# Patient Record
Sex: Male | Born: 1960 | Race: Black or African American | Hispanic: No | Marital: Married | Smoking: Never smoker
Health system: Southern US, Community
[De-identification: ages and names within clinical notes are randomized; demographics above are authoritative.]

## PROBLEM LIST (undated history)

## (undated) DIAGNOSIS — I10 Essential (primary) hypertension: Secondary | ICD-10-CM

## (undated) HISTORY — PX: SHOULDER SURGERY: SHX246

---

## 2000-11-19 ENCOUNTER — Emergency Department (HOSPITAL_COMMUNITY): Admission: EM | Admit: 2000-11-19 | Discharge: 2000-11-20 | Payer: Self-pay

## 2001-05-17 ENCOUNTER — Encounter: Payer: Self-pay | Admitting: *Deleted

## 2001-05-17 ENCOUNTER — Encounter: Admission: RE | Admit: 2001-05-17 | Discharge: 2001-05-17 | Payer: Self-pay | Admitting: *Deleted

## 2001-08-19 ENCOUNTER — Encounter: Payer: Self-pay | Admitting: Emergency Medicine

## 2001-08-19 ENCOUNTER — Emergency Department (HOSPITAL_COMMUNITY): Admission: EM | Admit: 2001-08-19 | Discharge: 2001-08-19 | Payer: Self-pay

## 2006-04-06 ENCOUNTER — Emergency Department (HOSPITAL_COMMUNITY): Admission: EM | Admit: 2006-04-06 | Discharge: 2006-04-06 | Payer: Self-pay | Admitting: Emergency Medicine

## 2010-05-11 ENCOUNTER — Emergency Department (HOSPITAL_BASED_OUTPATIENT_CLINIC_OR_DEPARTMENT_OTHER): Admission: EM | Admit: 2010-05-11 | Discharge: 2010-05-11 | Payer: Self-pay | Admitting: Emergency Medicine

## 2011-03-02 ENCOUNTER — Inpatient Hospital Stay (HOSPITAL_COMMUNITY): Payer: Self-pay

## 2011-03-02 ENCOUNTER — Emergency Department (HOSPITAL_BASED_OUTPATIENT_CLINIC_OR_DEPARTMENT_OTHER)
Admission: EM | Admit: 2011-03-02 | Discharge: 2011-03-02 | Disposition: A | Discharge status: Other Acute Inpatient Hospital | Payer: Self-pay | Attending: Emergency Medicine | Admitting: Emergency Medicine

## 2011-03-02 ENCOUNTER — Inpatient Hospital Stay (HOSPITAL_COMMUNITY)
Admission: EM | Admit: 2011-03-02 | Discharge: 2011-03-05 | DRG: 638 | Disposition: A | Discharge status: Home / Self Care | Payer: Self-pay | Source: Other Acute Inpatient Hospital | Attending: Internal Medicine | Admitting: Internal Medicine

## 2011-03-02 DIAGNOSIS — E871 Hypo-osmolality and hyponatremia: Secondary | ICD-10-CM | POA: Diagnosis not present

## 2011-03-02 DIAGNOSIS — N179 Acute kidney failure, unspecified: Secondary | ICD-10-CM | POA: Diagnosis present

## 2011-03-02 DIAGNOSIS — E1169 Type 2 diabetes mellitus with other specified complication: Secondary | ICD-10-CM | POA: Insufficient documentation

## 2011-03-02 DIAGNOSIS — E1100 Type 2 diabetes mellitus with hyperosmolarity without nonketotic hyperglycemic-hyperosmolar coma (NKHHC): Principal | ICD-10-CM | POA: Diagnosis present

## 2011-03-02 DIAGNOSIS — E876 Hypokalemia: Secondary | ICD-10-CM | POA: Diagnosis not present

## 2011-03-02 DIAGNOSIS — I10 Essential (primary) hypertension: Secondary | ICD-10-CM | POA: Diagnosis present

## 2011-03-02 DIAGNOSIS — R350 Frequency of micturition: Secondary | ICD-10-CM | POA: Insufficient documentation

## 2011-03-02 DIAGNOSIS — E785 Hyperlipidemia, unspecified: Secondary | ICD-10-CM | POA: Diagnosis present

## 2011-03-02 DIAGNOSIS — N289 Disorder of kidney and ureter, unspecified: Secondary | ICD-10-CM | POA: Insufficient documentation

## 2011-03-02 DIAGNOSIS — E669 Obesity, unspecified: Secondary | ICD-10-CM | POA: Diagnosis present

## 2011-03-02 DIAGNOSIS — Z23 Encounter for immunization: Secondary | ICD-10-CM

## 2011-03-02 DIAGNOSIS — E781 Pure hyperglyceridemia: Secondary | ICD-10-CM | POA: Diagnosis present

## 2011-03-02 LAB — GLUCOSE, CAPILLARY
Glucose-Capillary: >600 mg/dL (ref 70–99)
Glucose-Capillary: 258 mg/dL — ABNORMAL HIGH (ref 70–99)
Glucose-Capillary: 287 mg/dL — ABNORMAL HIGH (ref 70–99)
Glucose-Capillary: 275 mg/dL — ABNORMAL HIGH (ref 70–99)
Glucose-Capillary: 220 mg/dL — ABNORMAL HIGH (ref 70–99)

## 2011-03-02 LAB — URINE MICROSCOPIC-ADD ON
RBC / HPF: NONE SEEN RBC/hpf (ref <3)
WBC, UA: NONE SEEN WBC/hpf (ref <3)

## 2011-03-02 LAB — URINALYSIS, ROUTINE W REFLEX MICROSCOPIC
Bilirubin Urine: NEGATIVE
Glucose, UA: >1000 mg/dL — AB
Hgb urine dipstick: NEGATIVE
Ketones, ur: NEGATIVE mg/dL
Leukocytes, UA: NEGATIVE
Nitrite: NEGATIVE
Protein, ur: NEGATIVE mg/dL
Specific Gravity, Urine: 1.034 — ABNORMAL HIGH (ref 1.005–1.030)
Urobilinogen, UA: 0.2 mg/dL (ref 0.0–1.0)
pH: 5.5 (ref 5.0–8.0)

## 2011-03-02 LAB — HEMOGLOBIN A1C
Hgb A1c MFr Bld: 11.7 % — ABNORMAL HIGH (ref <5.7)
Mean Plasma Glucose: 289 mg/dL — ABNORMAL HIGH (ref <117)

## 2011-03-02 LAB — BASIC METABOLIC PANEL
BUN: 27 mg/dL — ABNORMAL HIGH (ref 6–23)
CO2: 25 mEq/L (ref 19–32)
Calcium: 10.2 mg/dL (ref 8.4–10.5)
Chloride: 81 mEq/L — ABNORMAL LOW (ref 96–112)
Creatinine, Ser: 1.8 mg/dL — ABNORMAL HIGH (ref 0.50–1.35)
GFR calc Af Amer: 49 mL/min — ABNORMAL LOW (ref >60)
GFR calc non Af Amer: 41 mL/min — ABNORMAL LOW (ref >60)
Glucose, Bld: 843 mg/dL (ref 70–99)
Potassium: 4.1 mEq/L (ref 3.5–5.1)
Sodium: 124 mEq/L — ABNORMAL LOW (ref 135–145)

## 2011-03-02 LAB — MRSA PCR SCREENING: MRSA by PCR: NEGATIVE

## 2011-03-02 LAB — DIFFERENTIAL
Basophils Relative: 0 % (ref 0–1)
Eosinophils Relative: 1 % (ref 0–5)
Monocytes Absolute: 0.5 10*3/uL (ref 0.1–1.0)
Monocytes Relative: 10 % (ref 3–12)
Neutro Abs: 1.7 10*3/uL (ref 1.7–7.7)

## 2011-03-02 LAB — COMPREHENSIVE METABOLIC PANEL
ALT: 25 U/L (ref 0–53)
Albumin: 4.1 g/dL (ref 3.5–5.2)
Alkaline Phosphatase: 101 U/L (ref 39–117)
Potassium: 4 mEq/L (ref 3.5–5.1)
Sodium: 136 mEq/L (ref 135–145)
Total Protein: 8.3 g/dL (ref 6.0–8.3)

## 2011-03-02 LAB — CBC
Hemoglobin: 16 g/dL (ref 13.0–17.0)
MCH: 29.5 pg (ref 26.0–34.0)
MCHC: 35.4 g/dL (ref 30.0–36.0)
RDW: 12.2 % (ref 11.5–15.5)

## 2011-03-02 LAB — CREATININE, URINE, RANDOM: Creatinine, Urine: 187.02 mg/dL

## 2011-03-03 LAB — GLUCOSE, CAPILLARY
Glucose-Capillary: 190 mg/dL — ABNORMAL HIGH (ref 70–99)
Glucose-Capillary: 112 mg/dL — ABNORMAL HIGH (ref 70–99)
Glucose-Capillary: 171 mg/dL — ABNORMAL HIGH (ref 70–99)
Glucose-Capillary: 172 mg/dL — ABNORMAL HIGH (ref 70–99)
Glucose-Capillary: 178 mg/dL — ABNORMAL HIGH (ref 70–99)
Glucose-Capillary: 248 mg/dL — ABNORMAL HIGH (ref 70–99)
Glucose-Capillary: 277 mg/dL — ABNORMAL HIGH (ref 70–99)
Glucose-Capillary: 327 mg/dL — ABNORMAL HIGH (ref 70–99)

## 2011-03-03 LAB — PHOSPHORUS: Phosphorus: 3 mg/dL (ref 2.3–4.6)

## 2011-03-03 LAB — CBC
Hemoglobin: 14.4 g/dL (ref 13.0–17.0)
MCH: 29.9 pg (ref 26.0–34.0)
MCHC: 35.6 g/dL (ref 30.0–36.0)
Platelets: 176 10*3/uL (ref 150–400)
RBC: 4.81 MIL/uL (ref 4.22–5.81)

## 2011-03-03 LAB — PROTIME-INR
INR: 0.91 (ref 0.00–1.49)
Prothrombin Time: 12.5 seconds (ref 11.6–15.2)

## 2011-03-03 LAB — DRUGS OF ABUSE SCREEN W/O ALC, ROUTINE URINE
Barbiturate Quant, Ur: NEGATIVE
Cocaine Metabolites: NEGATIVE
Creatinine,U: 176.2 mg/dL
Phencyclidine (PCP): NEGATIVE
Propoxyphene: NEGATIVE

## 2011-03-03 LAB — COMPREHENSIVE METABOLIC PANEL
ALT: 22 U/L (ref 0–53)
AST: 22 U/L (ref 0–37)
Alkaline Phosphatase: 75 U/L (ref 39–117)
CO2: 27 mEq/L (ref 19–32)
Calcium: 8.6 mg/dL (ref 8.4–10.5)
Potassium: 3.2 mEq/L — ABNORMAL LOW (ref 3.5–5.1)
Sodium: 137 mEq/L (ref 135–145)
Total Protein: 6.9 g/dL (ref 6.0–8.3)

## 2011-03-03 LAB — DIFFERENTIAL
Basophils Absolute: 0 10*3/uL (ref 0.0–0.1)
Basophils Relative: 1 % (ref 0–1)
Eosinophils Absolute: 0.1 10*3/uL (ref 0.0–0.7)
Monocytes Relative: 13 % — ABNORMAL HIGH (ref 3–12)
Neutro Abs: 1.2 10*3/uL — ABNORMAL LOW (ref 1.7–7.7)
Neutrophils Relative %: 27 % — ABNORMAL LOW (ref 43–77)

## 2011-03-03 LAB — MAGNESIUM: Magnesium: 2.3 mg/dL (ref 1.5–2.5)

## 2011-03-03 LAB — LIPID PANEL
HDL: 31 mg/dL — ABNORMAL LOW (ref >39)
LDL Cholesterol: UNDETERMINED mg/dL (ref 0–99)
Total CHOL/HDL Ratio: 6 RATIO

## 2011-03-04 LAB — GLUCOSE, CAPILLARY
Glucose-Capillary: 300 mg/dL — ABNORMAL HIGH (ref 70–99)
Glucose-Capillary: 202 mg/dL — ABNORMAL HIGH (ref 70–99)

## 2011-03-04 LAB — CBC
MCHC: 35.6 g/dL (ref 30.0–36.0)
Platelets: 144 10*3/uL — ABNORMAL LOW (ref 150–400)
RDW: 12.3 % (ref 11.5–15.5)

## 2011-03-04 LAB — BASIC METABOLIC PANEL
Chloride: 102 mEq/L (ref 96–112)
GFR calc non Af Amer: >60 mL/min (ref >60)
Glucose, Bld: 273 mg/dL — ABNORMAL HIGH (ref 70–99)
Potassium: 4.2 mEq/L (ref 3.5–5.1)
Sodium: 134 mEq/L — ABNORMAL LOW (ref 135–145)

## 2011-03-05 LAB — BASIC METABOLIC PANEL
Chloride: 103 mEq/L (ref 96–112)
GFR calc Af Amer: >60 mL/min (ref >60)
GFR calc non Af Amer: >60 mL/min (ref >60)
Glucose, Bld: 221 mg/dL — ABNORMAL HIGH (ref 70–99)
Potassium: 4.3 mEq/L (ref 3.5–5.1)
Sodium: 137 mEq/L (ref 135–145)

## 2011-03-05 LAB — CBC
Hemoglobin: 14.1 g/dL (ref 13.0–17.0)
MCHC: 35.5 g/dL (ref 30.0–36.0)
RDW: 12.3 % (ref 11.5–15.5)
WBC: 3.7 10*3/uL — ABNORMAL LOW (ref 4.0–10.5)

## 2011-03-05 LAB — GLUCOSE, CAPILLARY: Glucose-Capillary: 215 mg/dL — ABNORMAL HIGH (ref 70–99)

## 2011-03-08 NOTE — H&P (Signed)
[name] George, [name] George NO.:  [id number]  MEDICAL RECORD NO.:  [id number]  LOCATION:  3312                         FACILITY:  [facility]  PHYSICIAN:  Kathlen Mody, MD       DATE OF BIRTH:  April 11, 1961  DATE OF ADMISSION:  03/02/2011 DATE OF DISCHARGE:                             HISTORY & PHYSICAL   CHIEF COMPLAINT:  Transfer from [facility] for hyperglycemia with sugars in the 800s and new onset diabetes.  HISTORY OF PRESENT ILLNESS:  This is a 50 year old gentleman with history of hypertension, hyperlipidemia, and obesity who was transferred from [facility] for hyperglycemia with sugars in 800s and newly diagnosed diabetes.  The patient reports dizziness, blurry vision, and generalized weakness since 1 week associated with polyuria and polydipsia.  The patient denies any chest pain, shortness of breath, orthopnea, PND, or any syncope or palpitations.  Denies any shortness of breath.  The patient denies any nausea, vomiting, abdominal pain, or diarrhea.  No known family history of diabetes.  Over the last 1 week, the patient is complaining of polyuria and polydipsia.  The patient denies any headache or blurry vision at this time.  The patient denies any tingling or numbness or any focal weakness.  At 5 o'clock this a.m., the patient was on his way to work and he felt dizzy and he came to [employer] at which point his glucose was checked and it was in the 800s and he was transferred to [facility] for further evaluation and management.  REVIEW OF SYSTEMS:  See HPI, otherwise negative.  PAST MEDICAL HISTORY: 1. Hypertension. 2. Hyperlipidemia. 3. Obesity.  PAST SURGICAL HISTORY:  Right shoulder surgery for dislocated shoulder.  FAMILY HISTORY:  Mother has hypertension.  SOCIAL HISTORY:  The patient denies smoking.  Occasional EtOH.  Denies any recreational drugs.  The patient lives at home with his wife.  He works as a Ecologist.  ALLERGIES:  No known drug allergies.  HOME MEDICATIONS:  The patient is on lisinopril and hydrochlorothiazide.  PHYSICAL EXAMINATION:  VITAL SIGNS:  The patient's vitals on arrival, temperature of 97.8, pulse is 80 per minute, blood pressure 137/79, respirations 20-28, and saturating 98% on room air.  On arrival, his CBC was 531. GENERAL:  He is alert, afebrile, oriented x3, comfortable, in no acute distress. HEENT:  Pupils are reacting to light and accommodations.  No JVD.  Dry mucous membranes.  No scleral icterus. CARDIOVASCULAR:  S1 and S2 heard.  No rubs, murmurs, or gallops. Regular rate and rhythm. RESPIRATORY:  Chest is clear to auscultation bilaterally.  No wheezing or rhonchi. ABDOMEN:  Obese, soft, nontender, and nondistended.  Bowel sounds are heard. EXTREMITIES:  No pedal edema, cyanosis, or clubbing. NEUROLOGICAL:  No focal deficits at this point.  PERTINENT LABORATORY DATA:  At [facility], the patient had a urinalysis which was negative for nitrites and leukocytes with a urine glucose of more than 1000, negative ketones, no bilirubin.  Basic metabolic panel showed a glucose of 843 at 5:30 with a sodium of 124, potassium of 4.1, BUN 27, and creatinine 1.80.  CBC with  differential was within normal limits.  DIAGNOSTIC STUDIES:  None.  ASSESSMENT:  This is a 50 year old gentleman with history of hypertension and hyperlipidemia who is admitted for hyperglycemia, newly diagnosed diabetes.  The patient is in Step-Down at this time.  He is on a glucose nebulizer.  We will continue the glucose stabilizer for now. We will get a CMP in an hour.  We will get a hemoglobin A1c, fasting lipids, and a TSH.  Start the patient on IV normal saline at 150 mL per hour.  The patient will need inpatient diabetes education and also schedule an outpatient diabetes education.  The patient will ultimately need ophthalmology followup as outpatient.  Newly diagnosed  type 2 diabetes and hyperglycemia.  The patient's anion gap is 18.  At this time, we will get a repeat CMP in about an hour.  The patient's potassium is 4.1.  Acute renal failure, most likely prerenal from dehydration.  The patient is being hydrated with normal saline at 150 mL.  At the same time, we will get some urine sodium, urine creatinine, and ultrasound of kidney to rule out obstruction.  Hypertension appears to be controlled.  Lisinopril and hydrochlorothiazide are on hold for now secondary to renal failure.  We will restart them later on after the renal failure resolves.  Hyperlipidemia.  We will get a fasting lipid profile and start him on statins as needed.  Hyponatremia, most likely pseudohyponatremia secondary to hyperglycemia.  Deep vein thrombosis prophylaxis.  Subcutaneous Lovenox.  DIET:  Carb-modified diet.  ACTIVITY:  As tolerated.  The patient is a full code.          ______________________________ Kathlen Mody, MD     VA/MEDQ  D:  03/02/2011  T:  03/02/2011  Job:  [id number]  Electronically Signed by Kathlen Mody MD on 03/08/2011 02:34:13 AM

## 2011-03-16 NOTE — Discharge Summary (Signed)
[name], [name] NO.:  [id number]  MEDICAL RECORD NO.:  [id number]  LOCATION:  4503                         FACILITY:  [facility]  PHYSICIAN:  Thad Ranger, MD       DATE OF BIRTH:  [birth year]  DATE OF ADMISSION:  03/02/2011 DATE OF DISCHARGE:                        DISCHARGE SUMMARY - REFERRING   DISCHARGE DIAGNOSES: 1. Hyperosmolar hyperglycemia, improved. 2. New onset diabetes mellitus, uncontrolled. 3. Acute kidney injury secondary to dehydration, resolved. 4. Dehydration, resolved. 5. Hypertension. 6. Hyperlipidemia with hypertriglyceridemia. 7. Hyponatremia likely pseudohyponatremia secondary to hyperosmolar     hyperglycemia, improved.  CONSULTATIONS:  Dr. Talmage Coin on the phone consultation.  DISCHARGE MEDICATIONS: 1. Tradjenta 5 mg p.o. daily. 2. Metformin 1000 mg p.o. b.i.d. with meals. 3. Artificial tears 2 drops in both eyes daily as needed. 4. Fish oil over-the-counter 1 capsule p.o. daily.5. Lisinopril/hydrochlorothiazide 20/25 mg 1 tablet p.o. daily. 6. Vitamin E OTC 1 capsule p.o. daily.  BRIEF HISTORY OF PRESENT ILLNESS:  At the time of admission, [name] George is a 50 year old male with history of hypertension, hyperlipidemia.  He was transferred from [facility] for hyperglycemia with sugars in 800s and newly diagnosed with diabetes mellitus.  The patient reported dizziness, blurry vision and generalized weakness for a week associated with polyuria and polydipsia.  The patient presented to [facility] where his glucose was checked and was in 800s.  He was transferred to [facility] for further evaluation and management.  RADIOLOGICAL DATA:  Chest x-ray 2-view July 04, low-volume exam with minimal basilar atelectasis worse with scarring.  Renal ultrasound July 04 showed, 1. Both kidneys are normal in size without significant cortical     thinning or hydronephrosis. 2. A 1.5 cm cystic lesion in the lower  pole of the left kidney is not     clearly simple, consider ultrasound follow-up to assess the     stability.  PERTINENT LAB DIAGNOSTIC DATA:  UA at the time of admission showed more than 1000 glucose but negative protein.  BMET at the time of admission showed sodium 124, bicarb 25, glucose 843, creatinine 1.8, BUN 27.  CBC was unremarkable.  HbA1c 11.7 with mean plasma glucose of 289.  Urine drug screen was negative.  TSH 1.4.  Lipid profile showed cholesterol of 185, triglycerides 616, HDL 31.  BMET at the time of discharge, sodium 137, potassium 4.3, BUN 10, creatinine 1.0.  BRIEF HOSPITALIZATION COURSE:  [name] George is a 50 year old man who was admitted with hyperosmolar hyperglycemia. 1. Hyperosmolar hyperglycemia with new-onset diabetes mellitus.  The     patient was started on glucose stabilizer in a Step-Down Unit.  He     received IV insulin drip with aggressive IV fluid hydration.     Subsequently, the patient was transitioned to Lantus and sliding     scale insulin.  The patient was noticed to have an HbA1c of 11.7.     The patient's blood sugars did improve with insulin.  However, he     refused insulin at this time.  The patient stated that he would be     unable to keep his job especially with  new onset diabetes mellitus     requiring insulin as well as his new job with truck driving.  He     was started on metformin and I also discussed in detail with Dr.     Talmage Coin from Endocrinology with this particular scenario.  Per     Dr. Sharl Ma, it is okay to start him on second agent and the patient     was started on Tradjenta.  The patient was counseled strongly on     long-term effects of uncontrolled diabetes mellitus and compliance     with his medication.  He was also provided with prescription for     glucometer and counseled on keeping the log.  The patient will have     a follow-up appointment with Dr. Reather Littler from Endocrinology on     April 13, 2011, and further  adjustments in his medications can be     made during that time. 2. Acute kidney injury, likely prerenal from dehydration from     hyperosmolar hyperglycemia.  The patient was hydrated with normal     saline with IV fluids and the creatinine function has improved to     1.0 at the time of discharge.  Renal ultrasound was obtained and     did not show any acute obstruction.  He did show 1.5 cm cystic     lesion in the lower pole of left kidney.  The patient should have     repeat renal ultrasound in 3-6 months. 3. Hyperlipidemia.  The patient was not on any lipid lowering agents.     Lipid profile showed elevated triglycerides at 616.  The patient     was strongly counseled on weight loss and was started on Pravachol. 4. Hyponatremia from hyperglycemia.  This has resolved.  Currently at     the time of the dictation, sodium level is 137.  DISCHARGE DIET:  Carb-modified diet.  This was clearly explained to the patient.  Discharge follow-up with Dr. Reather Littler on April 13, 2011, at 11 a.m.  PHYSICAL EXAMINATION:  VITAL SIGNS:  Temp 97.4, pulse 73, respirations 20, blood pressure 121/81, O2 sat is 98% on room air. GENERAL:  The patient is alert, awake and oriented x3, not in acute distress. HEENT:  Anicteric sclerae.  Pink conjunctivae.  Pupils are reactive to light and accommodation.  EOMI. NECK: George.  No lymphopathy.  No JVD. CVS:  S1 and S2 clear.  Regular rate and rhythm. CHEST:  Clear to auscultation bilaterally. ABDOMEN:  Soft, nontender. EXTREMITIES:  No cyanosis, clubbing or edema noted in upper or lower extremities bilaterally.  DISCHARGE TIME:  35 minutes.    Thad Ranger, MD    RR/MEDQ  D:  03/05/2011  T:  03/05/2011  Job:  [id number]  cc:   Reather Littler, M.D.  Electronically Signed by Andres Labrum RAI  on 03/16/2011 05:44:12 PM

## 2012-02-27 ENCOUNTER — Other Ambulatory Visit: Payer: Self-pay | Admitting: Internal Medicine

## 2012-11-24 IMAGING — US US RENAL
1 series · 14 of 25 positions shown · non-contrast
Comparison: None.

CLINICAL DATA: Urinary frequency.  Renal insufficiency.

RENAL/URINARY TRACT ULTRASOUND COMPLETE

[Series 1: us renal · 0.41mm/px · 14 of 35 slices shown]
[im 1/35]
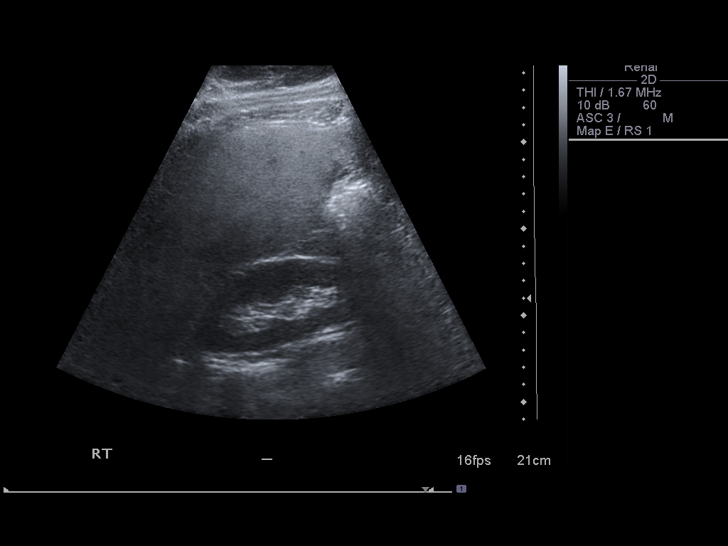
[im 3/35]
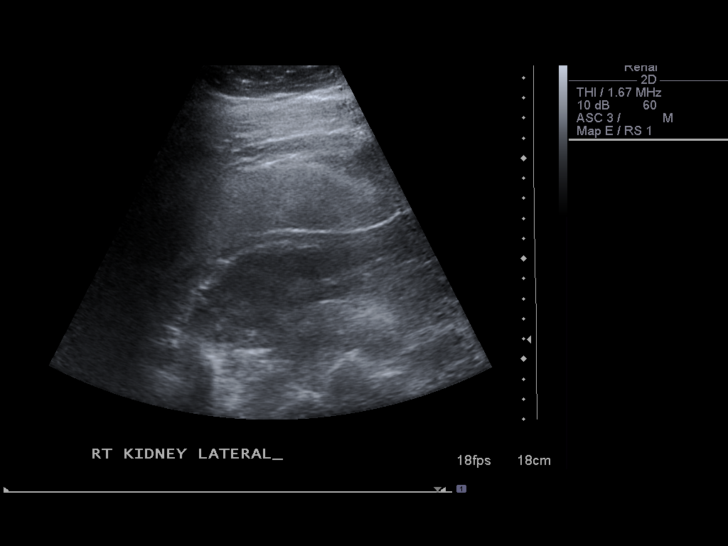
[im 6/35]
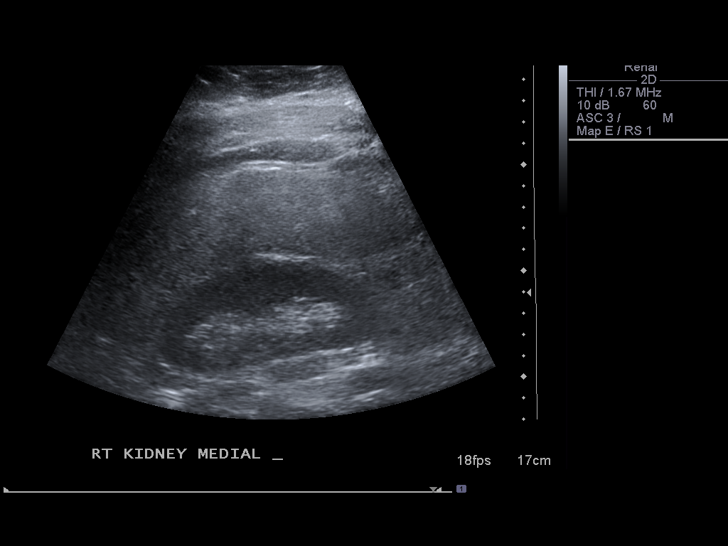
[im 9/35]
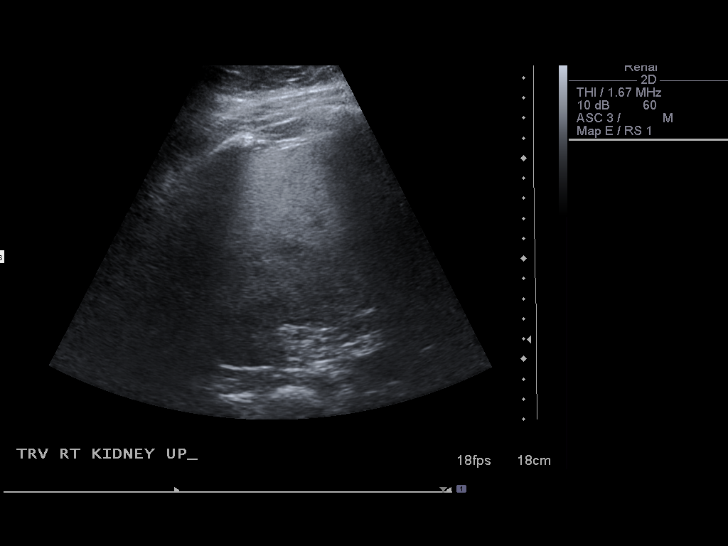
[im 12/35]
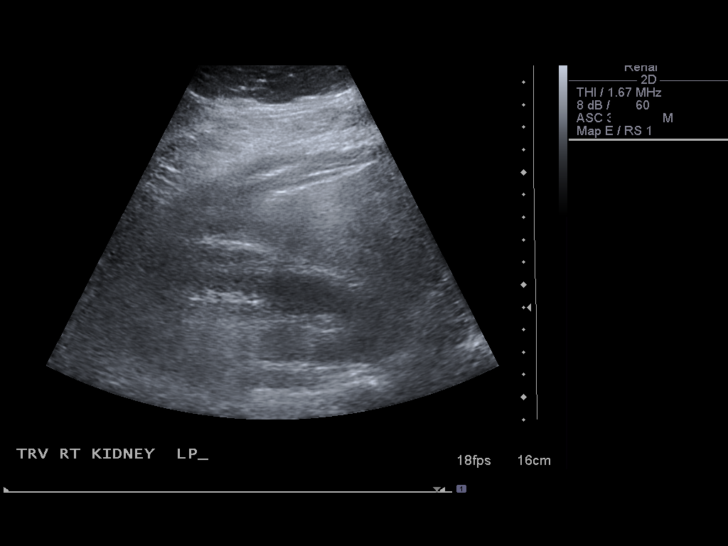
[im 13/35]
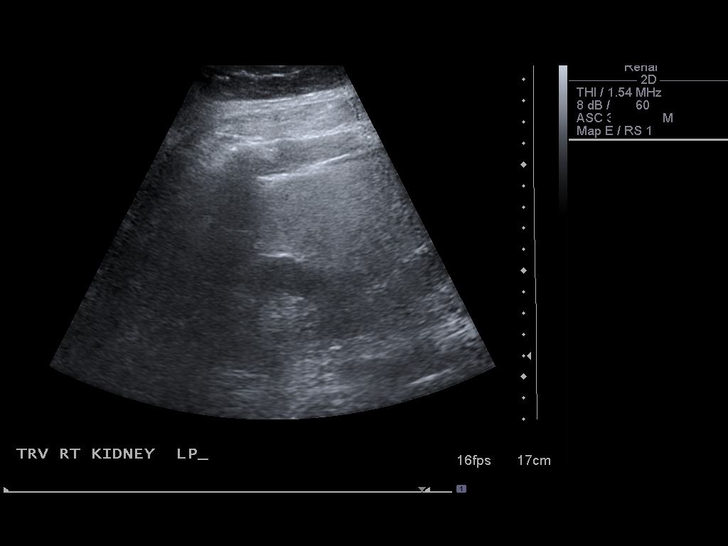
[im 16/35]
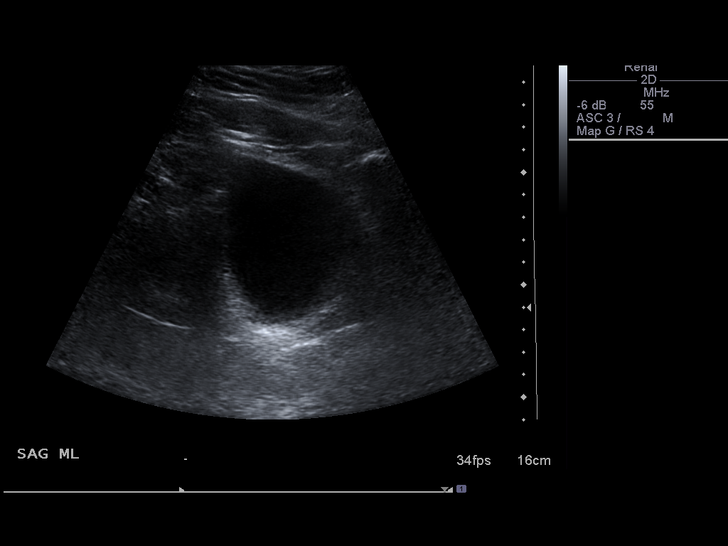
[im 19/35]
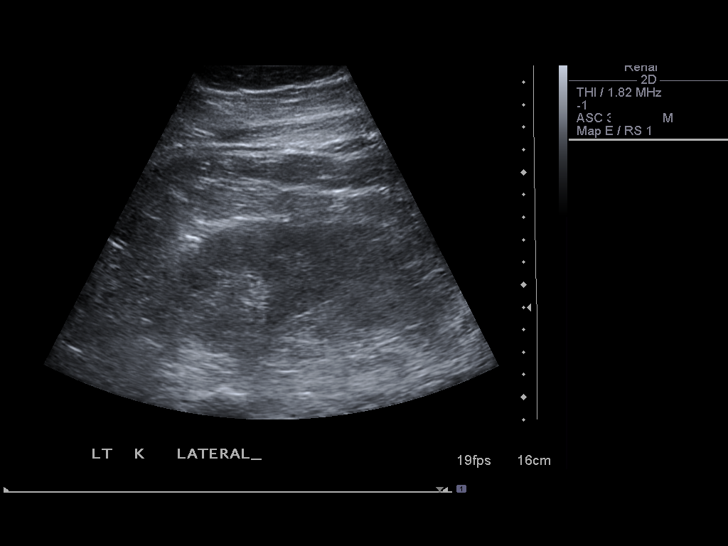
[im 22/35]
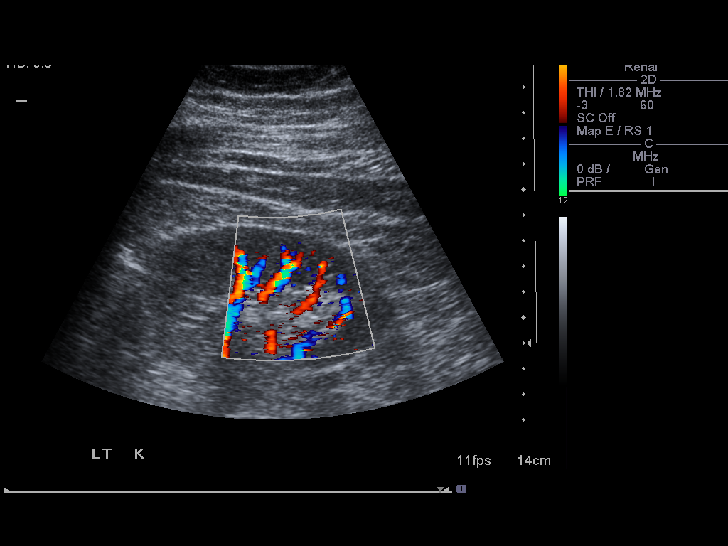
[im 23/35]
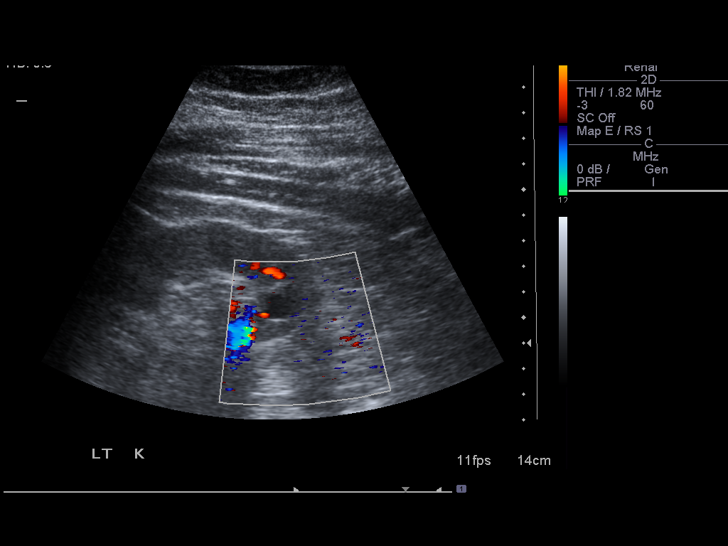
[im 26/35]
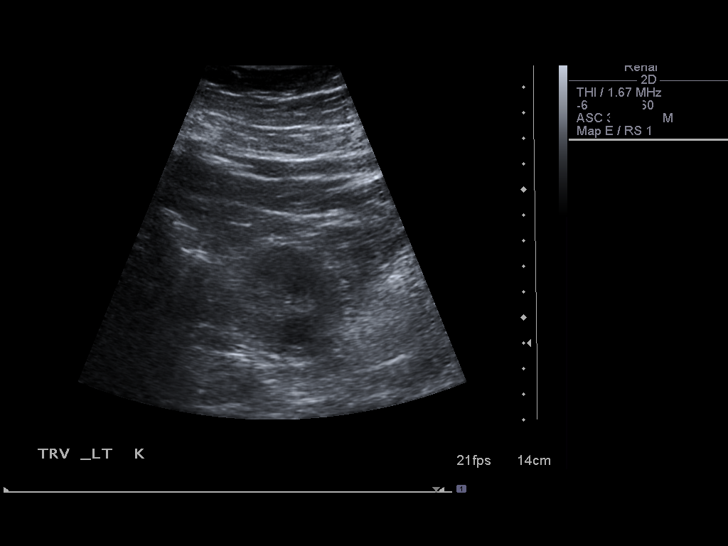
[im 29/35]
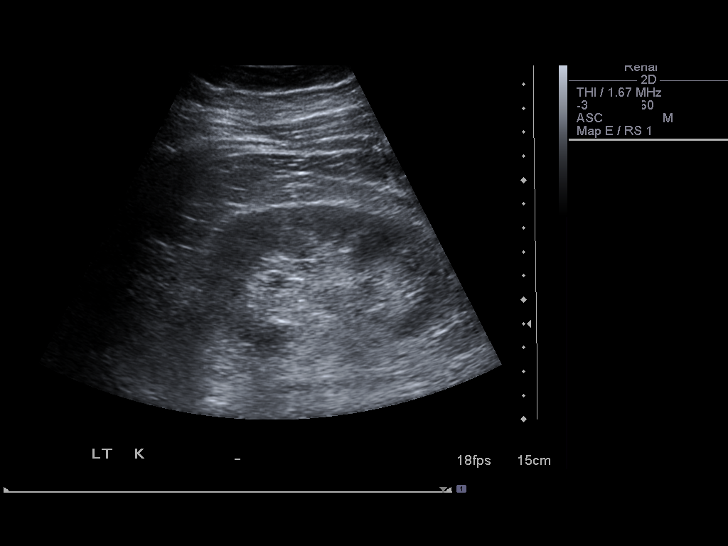
[im 32/35]
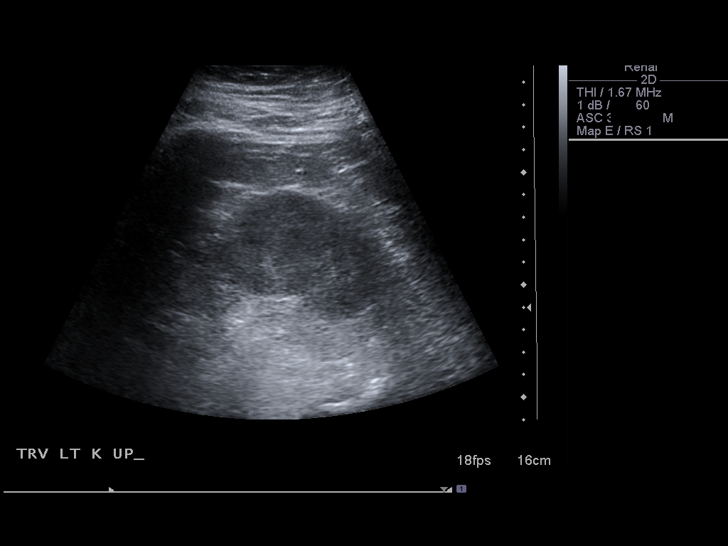
[im 35/35]
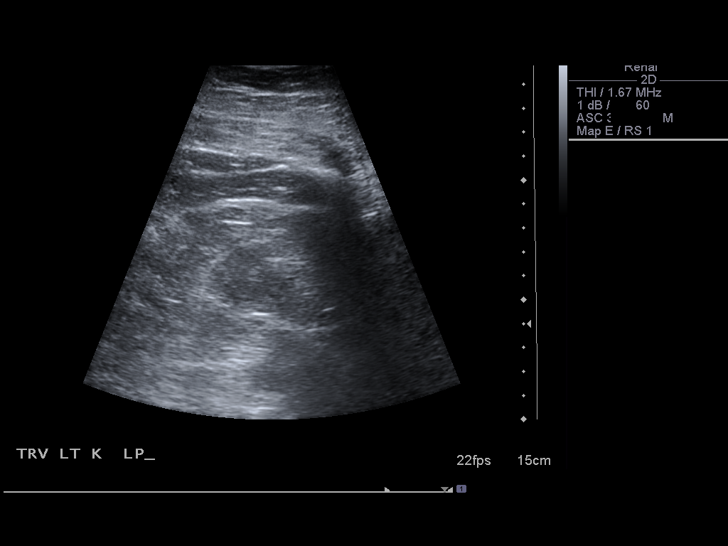

[14 of 25 positions shown; findings below may reference images not displayed]

FINDINGS: Right Kidney:  No hydronephrosis.  Well-preserved cortex.  Normal
size and parenchymal echotexture without focal abnormalities.
Renal length 11.1 cm.

Left Kidney:  No hydronephrosis.  Well preserved cortex.  Renal
length is 11.1 cm.  In the lower pole, there is a hypoechoic nodule
measuring 1.2 x 1.5 x 1.2 cm.  This is not well visualized,
although appears cystic and demonstrates no blood flow with color
Doppler.  No other focal renal lesions are demonstrated.

Bladder:  Unremarkable for degree of distension.

Incidentally noted is hepatic echogenicity suggesting hepatic
steatosis.
IMPRESSION: 1.  Both kidneys are normal in size without significant cortical
thinning or hydronephrosis.
2.  1.5 cm cystic lesion in the lower pole of the left kidney is
not clearly simple. Consider ultrasound followup to assess
stability.

## 2015-05-08 ENCOUNTER — Emergency Department (HOSPITAL_BASED_OUTPATIENT_CLINIC_OR_DEPARTMENT_OTHER)
Admission: EM | Admit: 2015-05-08 | Discharge: 2015-05-08 | Discharge status: Against Medical Advice | Payer: Commercial Managed Care - PPO | Attending: Emergency Medicine | Admitting: Emergency Medicine

## 2015-05-08 ENCOUNTER — Encounter (HOSPITAL_BASED_OUTPATIENT_CLINIC_OR_DEPARTMENT_OTHER): Payer: Self-pay | Admitting: *Deleted

## 2015-05-08 DIAGNOSIS — J019 Acute sinusitis, unspecified: Secondary | ICD-10-CM | POA: Insufficient documentation

## 2015-05-08 DIAGNOSIS — I10 Essential (primary) hypertension: Secondary | ICD-10-CM | POA: Insufficient documentation

## 2015-05-08 HISTORY — DX: Essential (primary) hypertension: I10

## 2015-05-08 NOTE — ED Notes (Signed)
Facial pain x 2 days. Headache. States he feels like he has sinusitis.

## 2015-05-08 NOTE — ED Notes (Signed)
Pt states he changed his mind and does not want to stay and be seen in the ED today. Informed pt that he is next to go back to a room, and encouraged him to stay for md eval. Pt states "I actually am feeling much better now, if I start to feel bac again I'll come back..." informed pt that if he leaves, he will have to start the triage and waiting process all over again, pt declines. Pt leaves ED with quick steady gait smiling in nad, accompanied by his wife.

## 2015-08-08 ENCOUNTER — Encounter (HOSPITAL_BASED_OUTPATIENT_CLINIC_OR_DEPARTMENT_OTHER): Payer: Self-pay | Admitting: *Deleted

## 2015-08-08 ENCOUNTER — Emergency Department (HOSPITAL_BASED_OUTPATIENT_CLINIC_OR_DEPARTMENT_OTHER)
Admission: EM | Admit: 2015-08-08 | Discharge: 2015-08-08 | Disposition: A | Discharge status: Home / Self Care | Payer: Self-pay | Attending: Emergency Medicine | Admitting: Emergency Medicine

## 2015-08-08 DIAGNOSIS — M546 Pain in thoracic spine: Secondary | ICD-10-CM | POA: Insufficient documentation

## 2015-08-08 DIAGNOSIS — M791 Myalgia, unspecified site: Secondary | ICD-10-CM

## 2015-08-08 DIAGNOSIS — M79621 Pain in right upper arm: Secondary | ICD-10-CM | POA: Insufficient documentation

## 2015-08-08 DIAGNOSIS — M79622 Pain in left upper arm: Secondary | ICD-10-CM | POA: Insufficient documentation

## 2015-08-08 DIAGNOSIS — I10 Essential (primary) hypertension: Secondary | ICD-10-CM | POA: Insufficient documentation

## 2015-08-08 LAB — CBC WITH DIFFERENTIAL/PLATELET
BASOS PCT: 0 %
Basophils Absolute: 0 10*3/uL (ref 0.0–0.1)
Eosinophils Absolute: 0.1 10*3/uL (ref 0.0–0.7)
Eosinophils Relative: 1 %
HEMATOCRIT: 47.1 % (ref 39.0–52.0)
HEMOGLOBIN: 15.7 g/dL (ref 13.0–17.0)
LYMPHS PCT: 48 %
Lymphs Abs: 2.4 10*3/uL (ref 0.7–4.0)
MCH: 29.2 pg (ref 26.0–34.0)
MCHC: 33.3 g/dL (ref 30.0–36.0)
MCV: 87.5 fL (ref 78.0–100.0)
MONOS PCT: 11 %
Monocytes Absolute: 0.6 10*3/uL (ref 0.1–1.0)
NEUTROS ABS: 2 10*3/uL (ref 1.7–7.7)
Neutrophils Relative %: 40 %
Platelets: 218 10*3/uL (ref 150–400)
RBC: 5.38 MIL/uL (ref 4.22–5.81)
RDW: 13.2 % (ref 11.5–15.5)
WBC: 4.9 10*3/uL (ref 4.0–10.5)

## 2015-08-08 LAB — BASIC METABOLIC PANEL
Anion gap: 7 (ref 5–15)
BUN: 15 mg/dL (ref 6–20)
CHLORIDE: 106 mmol/L (ref 101–111)
CO2: 26 mmol/L (ref 22–32)
CREATININE: 1.26 mg/dL — AB (ref 0.61–1.24)
Calcium: 9.4 mg/dL (ref 8.9–10.3)
GFR calc Af Amer: >60 mL/min (ref >60)
GFR calc non Af Amer: >60 mL/min (ref >60)
Glucose, Bld: 138 mg/dL — ABNORMAL HIGH (ref 65–99)
POTASSIUM: 3.9 mmol/L (ref 3.5–5.1)
SODIUM: 139 mmol/L (ref 135–145)

## 2015-08-08 MED ORDER — LISINOPRIL-HYDROCHLOROTHIAZIDE 10-12.5 MG PO TABS: 1.0000 | ORAL_TABLET | Freq: Every day | ORAL | Status: AC

## 2015-08-08 NOTE — ED Notes (Signed)
Patient c/o generalized body aches in upper part of his body for the past week, no fever, no n/v

## 2015-08-08 NOTE — ED Provider Notes (Signed)
CSN: [id number]     Arrival date & time 08/08/15  1324 History   First MD Initiated Contact with Patient 08/08/15 [phone]     No chief complaint on file.    (Consider location/radiation/quality/duration/timing/severity/associated sxs/prior Treatment) HPI  54 year old male with a history of hypertension presents with intermittent body aches for the past 2 weeks. The aching is mostly in his chest, upper arms, and upper back. Seems to come and go, does not occur every day. 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 A few days later he started having aching so he wasn't sure if that was related. He is currently on lisinopril although he takes it intermittently for hypertension and occasionally uses friends and families medicines for his blood pressure. He did not take any BP meds this AM. He denies headaches, fevers, vomiting, diarrhea, cough, sore throat, chest pressure/pain, or shortness of breath. No lower extremity aching. He does lift a lot or his job and he has had increased workload due to Christmas shopping and deliveries. However he does not think this is necessary the cause of it. He has been taking ibuprofen with good relief. He is worried he might have a "blood infection" due to the spider bite.  Past Medical History  Diagnosis Date  . Hypertension    Past Surgical History  Procedure Laterality Date  . Shoulder surgery     No family history on file. Social History  Substance Use Topics  . Smoking status: Never Smoker   . Smokeless tobacco: None  . Alcohol Use: No    Review of Systems  Constitutional: Negative for fever.  Respiratory: Positive for chest tightness (occasionally with the aches). Negative for cough and shortness of breath.   Cardiovascular: Negative for chest pain.  Gastrointestinal: Negative for vomiting and diarrhea.   Genitourinary: Negative for dysuria.  Musculoskeletal: Positive for myalgias.  Neurological: Negative for dizziness, weakness, numbness and headaches.  All other systems reviewed and are negative.     Allergies  Review of patient's allergies indicates no known allergies.  Home Medications   Prior to Admission medications   Medication Sig Start Date End Date Taking? Authorizing Provider  LISINOPRIL PO Take by mouth.    Historical Provider, MD   BP 178/105 mmHg  Pulse 67  Temp(Src) 97.8 F (36.6 C) (Oral)  Resp 18  Ht 6' (1.829 m)  Wt 169 lb (76.658 kg)  BMI 22.92 kg/m2  SpO2 96% Physical Exam  Constitutional: He is oriented to person, place, and time. He appears well-developed and well-nourished.  HENT:  Head: Normocephalic and atraumatic.  Right Ear: External ear normal.  Left Ear: External ear normal.  Nose: Nose normal.  Eyes: Right eye exhibits no discharge. Left eye exhibits no discharge.  Neck: Neck supple.  Cardiovascular: Normal rate, regular rhythm, normal heart sounds and intact distal pulses.   Pulmonary/Chest: Effort normal and breath sounds normal.  Abdominal: Soft. He exhibits no distension. There is no tenderness.  Musculoskeletal: He exhibits no edema.  No focal tenderness or spasm to upper chest/back and shoulders. Full ROM of major joints. No joint swelling. No erythema or abscess  Neurological: He is alert and oriented to person, place, and time.  Reflex Scores:      Bicep reflexes are 2+ on the right side and 2+ on the left side.  Patellar reflexes are 2+ on the right side and 2+ on the left side. 5/5 strength in all 4 extremities. Grossly normal sensation  Skin: Skin is warm and dry.  Nursing note and vitals reviewed.   ED Course  Procedures (including critical care time) Labs Review Labs Reviewed  BASIC METABOLIC PANEL - Abnormal; Notable for the following:    Glucose, Bld 138 (*)    Creatinine, Ser 1.26 (*)    All other components  within normal limits  CBC WITH DIFFERENTIAL/PLATELET    Imaging Review No results found. I have personally reviewed and evaluated these images and lab results as part of my medical decision-making.   EKG Interpretation   Date/Time:  Saturday August 08 2015 08:07:31 EST Ventricular Rate:  60 PR Interval:  174 QRS Duration: 86 QT Interval:  394 QTC Calculation: 394 R Axis:   26 Text Interpretation:  Normal sinus rhythm Nonspecific T wave abnormality  Abnormal ECG T waves less prominent than in 2007 Confirmed by Paloma Grange   MD, Quentyn Kolbeck ([id number]) on 08/08/2015 8:13:39 AM      MDM   Final diagnoses:  Myalgia  Essential hypertension    Unclear why the patient is having intermittent myalgias of his upper torso. On exam currently, patient denies any type of pain and states that the myalgias are actually gone. He is not on a statin and I have low suspicion for muscle breakdown or rhabdomyolysis. Intermittent chest tightness that seems more related to the myalgias than actual cardiac disease. Due to being on lisinopril labs were checked to evaluate electrolytes. No fevers or focal signs/symptoms to suggest infection. No abscess or cellulitis on upper back where patient describes a bite/sting 2 weeks ago. He is asking for a Rx for lisinopril, will give this and resources to help find a PCP.   Pricilla LovelessScott Aalijah Lanphere, MD 08/08/15 [phone]

## 2020-06-23 ENCOUNTER — Ambulatory Visit: Payer: Self-pay | Attending: Critical Care Medicine

## 2020-06-23 ENCOUNTER — Ambulatory Visit: Payer: Self-pay | Admitting: *Deleted

## 2020-06-23 DIAGNOSIS — Z23 Encounter for immunization: Secondary | ICD-10-CM

## 2020-06-23 NOTE — Progress Notes (Signed)
   Covid-19 Vaccination Clinic  Name:  [name] George    MRN: [id number] DOB: September 01, 1960  06/23/2020  [name] was observed post Covid-19 immunization for 15 minutes  without incident. He was provided with Vaccine Information Sheet and instruction to access the V-Safe system.   [name] was instructed to call 911 with any severe reactions post vaccine: Marland Kitchen Difficulty breathing  . Swelling of face and throat  . A fast heartbeat  . A bad rash all over body  . Dizziness and weakness   Immunizations Administered    Name Date Dose VIS Date Route   Moderna COVID-19 Vaccine 06/23/2020  9:00 AM 0.5 mL 07/2019 Intramuscular   Manufacturer: Moderna   Lot: [id number]   NDC: 75883-254-98

## 2020-06-23 NOTE — Progress Notes (Signed)
Patient presented for the first dose. Patient was observed without any concerns

## 2020-12-01 ENCOUNTER — Other Ambulatory Visit: Payer: Self-pay

## 2020-12-01 ENCOUNTER — Ambulatory Visit: Payer: Self-pay | Attending: Physician Assistant

## 2020-12-01 ENCOUNTER — Ambulatory Visit: Payer: Self-pay | Admitting: *Deleted

## 2020-12-01 DIAGNOSIS — Z23 Encounter for immunization: Secondary | ICD-10-CM

## 2020-12-01 NOTE — Progress Notes (Signed)
   Covid-19 Vaccination Clinic  Name:  [name] George    MRN: [id number] DOB: [birth year]  12/01/2020  Mr. [name] George was observed post Covid-19 immunization for 15 minutes without incident. He was provided with Vaccine Information Sheet and instruction to access the V-Safe system.   Mr. [name] George was instructed to call 911 with any severe reactions post vaccine: Marland Kitchen Difficulty breathing  . Swelling of face and throat  . A fast heartbeat  . A bad rash all over body  . Dizziness and weakness   Immunizations Administered    Name Date Dose VIS Date Route   Moderna Covid-19 Booster Vaccine 12/01/2020  4:00 PM 0.25 mL 06/17/2020 Intramuscular   Manufacturer: Gala Murdoch   Lot: [id number]   NDC: 33582-518-98

## 2020-12-01 NOTE — Progress Notes (Signed)
Patient presents for vaccine injection today. Patient tolerated injection well and was observed without any concerns.  

## 2021-10-12 ENCOUNTER — Ambulatory Visit: Payer: Self-pay
